# Patient Record
Sex: Male | Born: 1985 | Race: Black or African American | Hispanic: No | Marital: Single | Smoking: Current every day smoker
Health system: Southern US, Community
[De-identification: ages and names within clinical notes are randomized; demographics above are authoritative.]

## PROBLEM LIST (undated history)

## (undated) ENCOUNTER — Emergency Department: Admission: EM | Payer: Medicaid Other | Source: Home / Self Care

## (undated) DIAGNOSIS — I10 Essential (primary) hypertension: Secondary | ICD-10-CM

---

## 2003-12-08 ENCOUNTER — Emergency Department (HOSPITAL_COMMUNITY): Admission: EM | Admit: 2003-12-08 | Discharge: 2003-12-08 | Payer: Self-pay | Admitting: Family Medicine

## 2004-01-24 ENCOUNTER — Emergency Department (HOSPITAL_COMMUNITY): Admission: EM | Admit: 2004-01-24 | Discharge: 2004-01-24 | Payer: Self-pay | Admitting: Emergency Medicine

## 2004-01-24 ENCOUNTER — Emergency Department: Payer: Self-pay | Admitting: Emergency Medicine

## 2005-04-29 ENCOUNTER — Emergency Department: Payer: Self-pay | Admitting: Emergency Medicine

## 2005-04-30 ENCOUNTER — Emergency Department: Payer: Self-pay | Admitting: Unknown Physician Specialty

## 2005-08-21 ENCOUNTER — Emergency Department: Payer: Self-pay | Admitting: Emergency Medicine

## 2007-06-03 ENCOUNTER — Emergency Department: Payer: Self-pay | Admitting: Emergency Medicine

## 2008-07-27 ENCOUNTER — Emergency Department: Payer: Self-pay | Admitting: Emergency Medicine

## 2009-08-29 ENCOUNTER — Emergency Department: Payer: Self-pay | Admitting: Emergency Medicine

## 2009-12-09 ENCOUNTER — Emergency Department: Payer: Self-pay | Admitting: Emergency Medicine

## 2010-07-02 ENCOUNTER — Emergency Department: Payer: Self-pay | Admitting: Emergency Medicine

## 2011-06-28 ENCOUNTER — Emergency Department: Payer: Self-pay | Admitting: Emergency Medicine

## 2012-02-19 ENCOUNTER — Emergency Department: Payer: Self-pay | Admitting: Emergency Medicine

## 2012-02-24 ENCOUNTER — Emergency Department: Payer: Self-pay | Admitting: Emergency Medicine

## 2012-03-01 ENCOUNTER — Emergency Department: Payer: Self-pay | Admitting: Emergency Medicine

## 2012-03-10 ENCOUNTER — Emergency Department: Payer: Self-pay | Admitting: Emergency Medicine

## 2013-09-09 ENCOUNTER — Emergency Department: Payer: Self-pay | Admitting: Emergency Medicine

## 2016-09-08 ENCOUNTER — Encounter (HOSPITAL_COMMUNITY): Payer: Self-pay

## 2016-09-08 ENCOUNTER — Emergency Department (HOSPITAL_COMMUNITY)
Admission: EM | Admit: 2016-09-08 | Discharge: 2016-09-08 | Disposition: A | Discharge status: Home / Self Care | Payer: No Typology Code available for payment source | Attending: Emergency Medicine | Admitting: Emergency Medicine

## 2016-09-08 DIAGNOSIS — S161XXA Strain of muscle, fascia and tendon at neck level, initial encounter: Secondary | ICD-10-CM | POA: Insufficient documentation

## 2016-09-08 DIAGNOSIS — Y999 Unspecified external cause status: Secondary | ICD-10-CM | POA: Diagnosis not present

## 2016-09-08 DIAGNOSIS — Y92410 Unspecified street and highway as the place of occurrence of the external cause: Secondary | ICD-10-CM | POA: Diagnosis not present

## 2016-09-08 DIAGNOSIS — S199XXA Unspecified injury of neck, initial encounter: Secondary | ICD-10-CM | POA: Diagnosis present

## 2016-09-08 DIAGNOSIS — Y9389 Activity, other specified: Secondary | ICD-10-CM | POA: Diagnosis not present

## 2016-09-08 DIAGNOSIS — F17200 Nicotine dependence, unspecified, uncomplicated: Secondary | ICD-10-CM | POA: Diagnosis not present

## 2016-09-08 MED ORDER — NAPROXEN 500 MG PO TABS: 500.0000 mg | ORAL_TABLET | Freq: Two times a day (BID) | ORAL | 0 refills | Status: DC

## 2016-09-08 MED ORDER — METHOCARBAMOL 500 MG PO TABS: 500.0000 mg | ORAL_TABLET | Freq: Two times a day (BID) | ORAL | 0 refills | Status: DC

## 2016-09-08 NOTE — Discharge Instructions (Signed)
Do not drive while taking the muscle relaxant as it will make you sleepy. °
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
## 2017-08-15 ENCOUNTER — Other Ambulatory Visit: Payer: Self-pay

## 2017-08-15 DIAGNOSIS — Y999 Unspecified external cause status: Secondary | ICD-10-CM | POA: Insufficient documentation

## 2017-08-15 DIAGNOSIS — W228XXA Striking against or struck by other objects, initial encounter: Secondary | ICD-10-CM | POA: Insufficient documentation

## 2017-08-15 DIAGNOSIS — Z23 Encounter for immunization: Secondary | ICD-10-CM | POA: Insufficient documentation

## 2017-08-15 DIAGNOSIS — Y929 Unspecified place or not applicable: Secondary | ICD-10-CM | POA: Insufficient documentation

## 2017-08-15 DIAGNOSIS — F17210 Nicotine dependence, cigarettes, uncomplicated: Secondary | ICD-10-CM | POA: Insufficient documentation

## 2017-08-15 DIAGNOSIS — S0181XA Laceration without foreign body of other part of head, initial encounter: Secondary | ICD-10-CM | POA: Insufficient documentation

## 2017-08-15 DIAGNOSIS — Y939 Activity, unspecified: Secondary | ICD-10-CM | POA: Insufficient documentation

## 2017-08-15 DIAGNOSIS — Z79899 Other long term (current) drug therapy: Secondary | ICD-10-CM | POA: Insufficient documentation

## 2017-08-15 NOTE — ED Triage Notes (Signed)
Pt was working on storage building when garage door fell and hit him in forehead. Lac noted to area with no bleeding at this time. No loc, pt alert and awake.

## 2017-08-16 ENCOUNTER — Emergency Department: Payer: Self-pay

## 2017-08-16 ENCOUNTER — Emergency Department
Admission: EM | Admit: 2017-08-16 | Discharge: 2017-08-16 | Disposition: A | Discharge status: Home / Self Care | Payer: Self-pay | Attending: Emergency Medicine | Admitting: Emergency Medicine

## 2017-08-16 DIAGNOSIS — S0181XA Laceration without foreign body of other part of head, initial encounter: Secondary | ICD-10-CM

## 2017-08-16 MED ORDER — LIDOCAINE HCL (PF) 1 % IJ SOLN
5.0000 mL | Freq: Once | INTRAMUSCULAR | Status: AC
  Administered 2017-08-16: 5 mL via INTRADERMAL

## 2017-08-16 MED ORDER — LIDOCAINE HCL (PF) 1 % IJ SOLN
INTRAMUSCULAR | Status: AC
  Filled 2017-08-16: qty 5

## 2017-08-16 MED ORDER — TETANUS-DIPHTH-ACELL PERTUSSIS 5-2.5-18.5 LF-MCG/0.5 IM SUSP
0.5000 mL | Freq: Once | INTRAMUSCULAR | Status: AC
  Administered 2017-08-16: 0.5 mL via INTRAMUSCULAR

## 2017-08-16 MED ORDER — TETANUS-DIPHTH-ACELL PERTUSSIS 5-2.5-18.5 LF-MCG/0.5 IM SUSP
INTRAMUSCULAR | Status: AC
  Filled 2017-08-16: qty 0.5

## 2017-08-16 NOTE — ED Provider Notes (Signed)
[facility] Emergency Department Provider Note   First MD Initiated Contact with Patient 08/16/17 0142     (approximate)  I have reviewed the triage vital signs and the nursing notes.   HISTORY  Chief Complaint Laceration    HPI Bryan Moody is a 32 y.o. male presents to the emergency department with history of being struck on the forehead by a storage garage door with resultant laceration.  Patient admits to being dazed initially and had a brief episode of confusion while in route to the emergency department.  Patient denies any weakness numbness gait instability or visual changes.   Past medical history None There are no active problems to display for this patient.   Past surgical history None  Prior to Admission medications   Medication Sig Start Date End Date Taking? Authorizing Provider  methocarbamol (ROBAXIN) 500 MG tablet Take 1 tablet (500 mg total) by mouth 2 (two) times daily. 09/08/16   Janne NapoleonNeese, Hope M, NP  naproxen (NAPROSYN) 500 MG tablet Take 1 tablet (500 mg total) by mouth 2 (two) times daily. 09/08/16   Janne NapoleonNeese, Hope M, NP    Allergies No known drug allergies  No family history on file.  Social History Social History   Tobacco Use  . Smoking status: Current Every Day Smoker  . Smokeless tobacco: Never Used  Substance Use Topics  . Alcohol use: Yes  . Drug use: Yes    Types: Marijuana    Review of Systems Constitutional: No fever/chills Eyes: No visual changes. ENT: No sore throat. Cardiovascular: Denies chest pain. Respiratory: Denies shortness of breath. Gastrointestinal: No abdominal pain.  No nausea, no vomiting.  No diarrhea.  No constipation. Genitourinary: Negative for dysuria. Musculoskeletal: Negative for neck pain.  Negative for back pain. Integumentary: Negative for rash.  Positive for laceration Neurological: Negative for headaches, focal weakness or  numbness.  ____________________________________________   PHYSICAL EXAM:  VITAL SIGNS: ED Triage Vitals  Enc Vitals Group     BP 08/15/17 2315 (!) 150/102     Pulse Rate 08/15/17 2315 69     Resp 08/15/17 2315 16     Temp 08/15/17 2315 99.1 F (37.3 C)     Temp Source 08/15/17 2315 Oral     SpO2 08/15/17 2315 99 %     Weight 08/15/17 2318 72.6 kg (160 lb)     Height 08/15/17 2318 1.803 m (5\' 11" )     Head Circumference --      Peak Flow --      Pain Score 08/15/17 2318 7     Pain Loc --      Pain Edu? --      Excl. in GC? --     Constitutional: Alert and oriented. Well appearing and in no acute distress. Eyes: Conjunctivae are normal. PERRL. EOMI. Head: 7 cm linear forehead laceration Mouth/Throat: Mucous membranes are moist.  Oropharynx non-erythematous. Neck: No stridor. No cervical spine tenderness to palpation. Cardiovascular: Normal rate, regular rhythm. Good peripheral circulation. Grossly normal heart sounds. Respiratory: Normal respiratory effort.  No retractions. Lungs CTAB. Gastrointestinal: Soft and nontender. No distention.  Musculoskeletal: No lower extremity tenderness nor edema. No gross deformities of extremities. Neurologic:  Normal speech and language. No gross focal neurologic deficits are appreciated.  Skin:  7 cm linear forehead laceration Psychiatric: Mood and affect are normal. Speech and behavior are normal.  ____________________________________________   ________________________  RADIOLOGY I, Darci CurrentANDOLPH N Elinore Shults, personally viewed and evaluated these images (plain radiographs)  as part of my medical decision making, as well as reviewing the written report by the radiologist.  ED MD interpretation:  Forehead scalp contusion  Official radiology report(s): Ct Head Wo Contrast  Result Date: 08/16/2017 CLINICAL DATA:  Garage door fell and hit the patient on forehead. EXAM: CT HEAD WITHOUT CONTRAST TECHNIQUE: Contiguous axial images were obtained  from the base of the skull through the vertex without intravenous contrast. COMPARISON:  None. FINDINGS: Brain: No evidence of acute infarction, hemorrhage, hydrocephalus, extra-axial collection or mass lesion/mass effect. Vascular: No hyperdense vessel or unexpected calcification. Skull: Normal. Negative for fracture or focal lesion. Sinuses/Orbits: No acute finding. Other: Mild forehead scalp contusion. IMPRESSION: 1. Scalp contusion of the forehead. 2. No acute intracranial abnormality. Electronically Signed   By: Tollie Eth M.D.   On: 08/16/2017 02:26      .Marland KitchenLaceration Repair Date/Time: 08/16/2017 3:32 AM Performed by: Darci Current, MD Authorized by: Darci Current, MD   Consent:    Consent obtained:  Verbal   Consent given by:  Patient   Risks discussed:  Infection, pain, retained foreign body, poor cosmetic result and poor wound healing Anesthesia (see MAR for exact dosages):    Anesthesia method:  Local infiltration   Local anesthetic:  Lidocaine 1% w/o epi Laceration details:    Location:  Scalp   Scalp location:  Frontal   Length (cm):  7 Repair type:    Repair type:  Simple Exploration:    Hemostasis achieved with:  Direct pressure   Wound exploration: entire depth of wound probed and visualized     Contaminated: no   Treatment:    Area cleansed with:  Saline   Amount of cleaning:  Extensive   Irrigation solution:  Sterile saline   Visualized foreign bodies/material removed: no   Skin repair:    Repair method:  Sutures   Suture size:  6-0   Suture material:  Nylon   Number of sutures:  8 Approximation:    Approximation:  Close Post-procedure details:    Dressing:  Sterile dressing   Patient tolerance of procedure:  Tolerated well, no immediate complications     ____________________________________________   INITIAL IMPRESSION / ASSESSMENT AND PLAN / ED COURSE  As part of my medical decision making, I reviewed the following data within the  electronic MEDICAL RECORD NUMBER\ 32 year old male presented with above-stated history and physical exam secondary to forehead injury with laceration.  Wound repaired without difficulty.  Patient given tetanus shot as it has been greater than 5 years since last tetanus.  ____________________________________________  FINAL CLINICAL IMPRESSION(S) / ED DIAGNOSES  Final diagnoses:  Laceration of forehead, initial encounter     MEDICATIONS GIVEN DURING THIS VISIT:  Medications  lidocaine (PF) (XYLOCAINE) 1 % injection 5 mL (5 mLs Intradermal Given 08/16/17 0223)  Tdap (BOOSTRIX) injection 0.5 mL (0.5 mLs Intramuscular Given 08/16/17 0154)     ED Discharge Orders    None       Note:  This document was prepared using Dragon voice recognition software and may include unintentional dictation errors.    Darci Current, MD 08/16/17 [id number]

## 2017-08-24 ENCOUNTER — Encounter: Payer: Self-pay | Admitting: Emergency Medicine

## 2017-08-24 ENCOUNTER — Emergency Department
Admission: EM | Admit: 2017-08-24 | Discharge: 2017-08-24 | Disposition: A | Discharge status: Home / Self Care | Payer: Medicaid Other | Attending: Emergency Medicine | Admitting: Emergency Medicine

## 2017-08-24 ENCOUNTER — Other Ambulatory Visit: Payer: Self-pay

## 2017-08-24 DIAGNOSIS — Z4802 Encounter for removal of sutures: Secondary | ICD-10-CM | POA: Insufficient documentation

## 2017-08-24 NOTE — ED Notes (Signed)
8 sutures removed without difficulty. No bleeding or breaks in the skin. Wound is completely closed with no drainage.

## 2017-08-24 NOTE — ED Triage Notes (Signed)
Here for suture removal

## 2017-08-24 NOTE — ED Provider Notes (Signed)
[facility] Emergency Department Provider Note   ____________________________________________   First MD Initiated Contact with Patient 08/24/17 1233     (approximate)  I have reviewed the triage vital signs and the nursing notes.   HISTORY  Chief Complaint Suture / Staple Removal    HPI Bryan Moody is a 32 y.o. male patient presents for suture removal from the forehead secondary to laceration on 08/16/2017.  Patient voices no complaints.  History reviewed. No pertinent past medical history.  There are no active problems to display for this patient.   History reviewed. No pertinent surgical history.  Prior to Admission medications   Medication Sig Start Date End Date Taking? Authorizing Provider  methocarbamol (ROBAXIN) 500 MG tablet Take 1 tablet (500 mg total) by mouth 2 (two) times daily. 09/08/16   Janne Napoleon, NP  naproxen (NAPROSYN) 500 MG tablet Take 1 tablet (500 mg total) by mouth 2 (two) times daily. 09/08/16   Janne Napoleon, NP    Allergies Patient has no known allergies.  No family history on file.  Social History Social History   Tobacco Use  . Smoking status: Current Every Day Smoker  . Smokeless tobacco: Never Used  Substance Use Topics  . Alcohol use: Yes  . Drug use: Yes    Types: Marijuana    Review of Systems  Constitutional: No fever/chills Eyes: No visual changes. ENT: No sore throat. Cardiovascular: Denies chest pain. Respiratory: Denies shortness of breath. Gastrointestinal: No abdominal pain.  No nausea, no vomiting.  No diarrhea.  No constipation. Genitourinary: Negative for dysuria. Musculoskeletal: Negative for back pain. Skin: Negative for rash. Neurological: Negative for headaches, focal weakness or numbness.   ____________________________________________   PHYSICAL EXAM:  VITAL SIGNS: ED Triage Vitals [08/24/17 1227]  Enc Vitals Group     BP (!) 164/105     Pulse Rate 65     Resp 14   Temp 98.4 F (36.9 C)     Temp Source Oral     SpO2 99 %     Weight 160 lb (72.6 kg)     Height 5\' 11"  (1.803 m)     Head Circumference      Peak Flow      Pain Score 1     Pain Loc      Pain Edu?      Excl. in GC?    Constitutional: Alert and oriented. Well appearing and in no acute distress. Cardiovascular: Normal rate, regular rhythm. Grossly normal heart sounds.  Good peripheral circulation.  Elevated blood pressure. Respiratory: Normal respiratory effort.  No retractions. Lungs CTAB. Neurologic:  Normal speech and language. No gross focal neurologic deficits are appreciated. No gait instability. Skin:  Skin is warm, dry and intact. No rash noted.  9 intact sutures. Psychiatric: Mood and affect are normal. Speech and behavior are normal.  ____________________________________________   LABS (all labs ordered are listed, but only abnormal results are displayed)  Labs Reviewed - No data to display ____________________________________________  EKG   ____________________________________________  RADIOLOGY  ED MD interpretation:    Official radiology report(s): No results found.  ____________________________________________   PROCEDURES  Procedure(s) performed: None  .Suture Removal Date/Time: 08/24/2017 12:54 PM Performed by: Joni Reining, PA-C Authorized by: Joni Reining, PA-C   Consent:    Consent obtained:  Verbal   Consent given by:  Patient   Risks discussed:  Bleeding, pain and wound separation Location:    Location:  Head/neck  Head/neck location:  Forehead Procedure details:    Wound appearance:  No signs of infection, good wound healing and clean   Number of sutures removed:  8 Post-procedure details:    Post-removal:  No dressing applied   Patient tolerance of procedure:  Tolerated well, no immediate complications    Critical Care performed: No  ____________________________________________   INITIAL IMPRESSION / ASSESSMENT AND  PLAN / ED COURSE  As part of my medical decision making, I reviewed the following data within the electronic MEDICAL RECORD NUMBER    Patient presents for suture removal.  8 intact sutures were removed and patient given discharge care instructions.      ____________________________________________   FINAL CLINICAL IMPRESSION(S) / ED DIAGNOSES  Final diagnoses:  Encounter for removal of sutures     ED Discharge Orders    None       Note:  This document was prepared using Dragon voice recognition software and may include unintentional dictation errors.    Joni ReiningSmith, Ronald K, PA-C 08/24/17 1256    Sharyn CreamerQuale, Mark, MD 08/24/17 [phone]

## 2018-04-04 ENCOUNTER — Emergency Department
Admission: EM | Admit: 2018-04-04 | Discharge: 2018-04-04 | Disposition: A | Discharge status: Home / Self Care | Payer: Medicaid Other | Attending: Emergency Medicine | Admitting: Emergency Medicine

## 2018-04-04 DIAGNOSIS — Z5321 Procedure and treatment not carried out due to patient leaving prior to being seen by health care provider: Secondary | ICD-10-CM | POA: Insufficient documentation

## 2018-04-04 DIAGNOSIS — M79645 Pain in left finger(s): Secondary | ICD-10-CM | POA: Insufficient documentation

## 2018-04-04 NOTE — ED Notes (Signed)
Pt reports doesn't want to wait any longer; instr to return for any new or worsening symptoms
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
## 2018-05-02 ENCOUNTER — Other Ambulatory Visit: Payer: Self-pay

## 2018-05-02 ENCOUNTER — Encounter: Payer: Self-pay | Admitting: Emergency Medicine

## 2018-05-02 ENCOUNTER — Emergency Department
Admission: EM | Admit: 2018-05-02 | Discharge: 2018-05-02 | Disposition: A | Discharge status: Home / Self Care | Payer: Medicaid Other | Attending: Emergency Medicine | Admitting: Emergency Medicine

## 2018-05-02 DIAGNOSIS — Z79899 Other long term (current) drug therapy: Secondary | ICD-10-CM | POA: Insufficient documentation

## 2018-05-02 DIAGNOSIS — F17200 Nicotine dependence, unspecified, uncomplicated: Secondary | ICD-10-CM | POA: Insufficient documentation

## 2018-05-02 DIAGNOSIS — R51 Headache: Secondary | ICD-10-CM | POA: Insufficient documentation

## 2018-05-02 DIAGNOSIS — R519 Headache, unspecified: Secondary | ICD-10-CM

## 2018-05-02 MED ORDER — KETOROLAC TROMETHAMINE 30 MG/ML IJ SOLN
30.0000 mg | Freq: Once | INTRAMUSCULAR | Status: AC
  Administered 2018-05-02: 30 mg via INTRAMUSCULAR
  Filled 2018-05-02: qty 1

## 2018-05-02 MED ORDER — BUTALBITAL-APAP-CAFFEINE 50-325-40 MG PO TABS: 1.0000 | ORAL_TABLET | Freq: Four times a day (QID) | ORAL | 0 refills | Status: AC | PRN

## 2018-05-02 MED ORDER — BUTALBITAL-APAP-CAFFEINE 50-325-40 MG PO TABS
1.0000 | ORAL_TABLET | Freq: Once | ORAL | Status: AC
  Administered 2018-05-02: 1 via ORAL
  Filled 2018-05-02: qty 1

## 2018-05-02 NOTE — ED Triage Notes (Signed)
Pt c/o headache since this am. PT states hx of migraines. NAD noted, VSS, speech clear
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
## 2018-05-21 ENCOUNTER — Emergency Department
Admission: EM | Admit: 2018-05-21 | Discharge: 2018-05-21 | Disposition: A | Discharge status: Home / Self Care | Payer: Medicaid Other | Attending: Emergency Medicine | Admitting: Emergency Medicine

## 2018-05-21 ENCOUNTER — Encounter: Payer: Self-pay | Admitting: Emergency Medicine

## 2018-05-21 ENCOUNTER — Other Ambulatory Visit: Payer: Self-pay

## 2018-05-21 DIAGNOSIS — F17210 Nicotine dependence, cigarettes, uncomplicated: Secondary | ICD-10-CM | POA: Insufficient documentation

## 2018-05-21 DIAGNOSIS — M546 Pain in thoracic spine: Secondary | ICD-10-CM

## 2018-05-21 DIAGNOSIS — I10 Essential (primary) hypertension: Secondary | ICD-10-CM | POA: Insufficient documentation

## 2018-05-21 HISTORY — DX: Essential (primary) hypertension: I10
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
## 2018-05-21 NOTE — ED Notes (Signed)
EDP at bedside  

## 2019-01-09 ENCOUNTER — Ambulatory Visit: Payer: Self-pay | Admitting: Physician Assistant

## 2019-01-09 ENCOUNTER — Other Ambulatory Visit: Payer: Self-pay

## 2019-01-09 DIAGNOSIS — Z113 Encounter for screening for infections with a predominantly sexual mode of transmission: Secondary | ICD-10-CM

## 2019-01-09 LAB — GRAM STAIN

## 2019-01-10 ENCOUNTER — Encounter: Payer: Self-pay | Admitting: Physician Assistant

## 2019-01-10 NOTE — Progress Notes (Signed)
    STI clinic/screening visit  Subjective:  Bryan Moody is a 33 y.o. male being seen today for an STI screening visit. The patient reports they do not have symptoms.  Patient has the following medical conditions:  There are no active problems to display for this patient.    Chief Complaint  Patient presents with  . SEXUALLY TRANSMITTED DISEASE    HPI  Patient reports that he has had an odor to his urine for years.  Denies other symptoms.  Requests screening today.  See flowsheet for further details and programmatic requirements.    The following portions of the patient's history were reviewed and updated as appropriate: allergies, current medications, past medical history, past social history, past surgical history and problem list.  Objective:  There were no vitals filed for this visit.  Physical Exam Constitutional:      General: He is not in acute distress.    Appearance: Normal appearance.  HENT:     Head: Normocephalic and atraumatic.     Mouth/Throat:     Mouth: Mucous membranes are moist.     Pharynx: Oropharynx is clear. No oropharyngeal exudate or posterior oropharyngeal erythema.  Eyes:     Conjunctiva/sclera: Conjunctivae normal.  Neck:     Musculoskeletal: Neck supple.  Pulmonary:     Effort: Pulmonary effort is normal.  Abdominal:     Palpations: Abdomen is soft. There is no mass.     Tenderness: There is no abdominal tenderness. There is no guarding or rebound.  Genitourinary:    Penis: Normal.      Scrotum/Testes: Normal.     Comments: Pubic area without nits, lice, edema, erythema, lesions, and inguinal adenopathy. Penis circumcised and without discharge at meatus. Lymphadenopathy:     Cervical: No cervical adenopathy.  Skin:    General: Skin is warm and dry.     Findings: No bruising, erythema, lesion or rash.  Neurological:     Mental Status: He is alert and oriented to person, place, and time.  Psychiatric:        Mood and Affect: Mood  normal.        Behavior: Behavior normal.        Thought Content: Thought content normal.        Judgment: Judgment normal.       Assessment and Plan:  Bryan Moody is a 33 y.o. male presenting to the [facility] Department for STI screening  1. Screening for STD (sexually transmitted disease) Patient into clinic requesting screening.  Declines blood work today. Rec condoms with all sex. Await test results.  Counseled that RN will call if needs to RTC for any treatment once results are back. - Chlamydia/Gonorrhea [facility] - Gram stain     No follow-ups on file.  No future appointments.  Jerene Dilling, PA

## 2019-02-12 ENCOUNTER — Encounter: Payer: Self-pay | Admitting: Emergency Medicine

## 2019-02-12 ENCOUNTER — Emergency Department
Admission: EM | Admit: 2019-02-12 | Discharge: 2019-02-12 | Disposition: A | Discharge status: Home / Self Care | Payer: No Typology Code available for payment source | Attending: Emergency Medicine | Admitting: Emergency Medicine

## 2019-02-12 DIAGNOSIS — M79605 Pain in left leg: Secondary | ICD-10-CM | POA: Insufficient documentation

## 2019-02-12 DIAGNOSIS — M545 Low back pain: Secondary | ICD-10-CM | POA: Insufficient documentation

## 2019-02-12 DIAGNOSIS — M79604 Pain in right leg: Secondary | ICD-10-CM | POA: Insufficient documentation

## 2019-02-12 MED ORDER — MELOXICAM 15 MG PO TABS: 15.0000 mg | ORAL_TABLET | Freq: Every day | ORAL | 1 refills | Status: AC

## 2019-02-12 MED ORDER — METHOCARBAMOL 500 MG PO TABS: 500.0000 mg | ORAL_TABLET | Freq: Three times a day (TID) | ORAL | 0 refills | Status: AC | PRN

## 2019-02-12 MED ORDER — KETOROLAC TROMETHAMINE 30 MG/ML IJ SOLN
30.0000 mg | Freq: Once | INTRAMUSCULAR | Status: AC
  Administered 2019-02-12: 21:00:00 30 mg via INTRAMUSCULAR
  Filled 2019-02-12: qty 1
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
## 2020-05-16 ENCOUNTER — Emergency Department
Admission: EM | Admit: 2020-05-16 | Discharge: 2020-05-16 | Disposition: A | Discharge status: Home / Self Care | Payer: Self-pay | Attending: Student in an Organized Health Care Education/Training Program | Admitting: Student in an Organized Health Care Education/Training Program

## 2020-05-16 ENCOUNTER — Other Ambulatory Visit: Payer: Self-pay

## 2020-05-16 ENCOUNTER — Emergency Department: Payer: Self-pay

## 2020-05-16 ENCOUNTER — Encounter: Payer: Self-pay | Admitting: Emergency Medicine

## 2020-05-16 DIAGNOSIS — I10 Essential (primary) hypertension: Secondary | ICD-10-CM | POA: Insufficient documentation

## 2020-05-16 DIAGNOSIS — F17210 Nicotine dependence, cigarettes, uncomplicated: Secondary | ICD-10-CM | POA: Insufficient documentation

## 2020-05-16 DIAGNOSIS — M5431 Sciatica, right side: Secondary | ICD-10-CM

## 2020-05-16 DIAGNOSIS — R202 Paresthesia of skin: Secondary | ICD-10-CM | POA: Insufficient documentation

## 2020-05-16 MED ORDER — PREDNISONE 10 MG PO TABS: ORAL_TABLET | ORAL | 0 refills | Status: DC

## 2020-05-16 MED ORDER — DEXAMETHASONE SODIUM PHOSPHATE 10 MG/ML IJ SOLN
10.0000 mg | Freq: Once | INTRAMUSCULAR | Status: AC
  Administered 2020-05-16: 10 mg via INTRAMUSCULAR
  Filled 2020-05-16: qty 1

## 2020-05-16 MED ORDER — TRAMADOL HCL 50 MG PO TABS: 50.0000 mg | ORAL_TABLET | Freq: Four times a day (QID) | ORAL | 0 refills | Status: DC | PRN

## 2020-05-16 NOTE — ED Notes (Signed)
See triage note  Presents with lower back pain which is moving into right leg  States pain started several months ago but has gotten worse  Unable to bear full wt  Denies any recent injury

## 2020-05-16 NOTE — ED Provider Notes (Signed)
[facility] Emergency Department Provider Note  ____________________________________________   Event Date/Time   First MD Initiated Contact with Patient 05/16/20 [phone]     (approximate)  I have reviewed the triage vital signs and the nursing notes.   HISTORY  Chief Complaint Back Pain   HPI Bryan Moody is a 35 y.o. male presents to the ED with complaint of back pain for approximately 6 months.  Patient states that he now has numbness radiating down into his right hip and leg.  He denies any recent injury.  He did have an injury to his back in 2014 but has not had to have routine visits with orthopedics or neurosurgery.  Patient states that he has been taking ibuprofen 5 tablets 3 times a day without any relief of his back pain.  He denies any paresthesias, incontinence of bowel or bladder.  He continues to ambulate without any assistance and has been going to work.  Rates his pain as 6 out of 10.  Patient is also aware that his blood pressure is elevated.  He states that off and on it has been elevated but refuses to take anything as he is "too young to take medicine".         Past Medical History:  Diagnosis Date  . Hypertension     There are no problems to display for this patient.   History reviewed. No pertinent surgical history.  Prior to Admission medications   Medication Sig Start Date End Date Taking? Authorizing Provider  predniSONE (DELTASONE) 10 MG tablet Take 6 tablets  today, on day 2 take 5 tablets, day 3 take 4 tablets, day 4 take 3 tablets, day 5 take  2 tablets and 1 tablet the last day 05/16/20  Yes Bridget Hartshorn L, PA-C  traMADol (ULTRAM) 50 MG tablet Take 1 tablet (50 mg total) by mouth every 6 (six) hours as needed. 05/16/20  Yes Tommi Rumps, PA-C    Allergies Patient has no known allergies.  History reviewed. No pertinent family history.  Social History Social History   Tobacco Use  . Smoking status: Current  Every Day Smoker    Packs/day: 1.00    Types: Cigarettes  . Smokeless tobacco: Never Used  Vaping Use  . Vaping Use: Never used  Substance Use Topics  . Alcohol use: Yes    Comment: occasional  . Drug use: Not Currently    Types: Marijuana    Review of Systems Constitutional: No fever/chills Eyes: No visual changes. Cardiovascular: Denies chest pain. Respiratory: Denies shortness of breath. Gastrointestinal: No abdominal pain.  No nausea, no vomiting.   Genitourinary: Negative for dysuria. Musculoskeletal: Positive low back pain, chronic.  Positive right leg radiculopathy. Skin: Negative for rash. Neurological: Negative for headaches, focal weakness or numbness.   ____________________________________________   PHYSICAL EXAM:  VITAL SIGNS: ED Triage Vitals  Enc Vitals Group     BP 05/16/20 0846 (!) 172/129     Pulse Rate 05/16/20 0846 75     Resp 05/16/20 0846 16     Temp 05/16/20 0846 98 F (36.7 C)     Temp Source 05/16/20 0846 Oral     SpO2 05/16/20 0846 100 %     Weight 05/16/20 0852 165 lb (74.8 kg)     Height 05/16/20 0852 5\' 11"  (1.803 m)     Head Circumference --      Peak Flow --      Pain Score 05/16/20 0852 6  Pain Loc --      Pain Edu? --      Excl. in GC? --     Constitutional: Alert and oriented. Well appearing and in no acute distress. Eyes: Conjunctivae are normal.  Head: Atraumatic. Neck: No stridor.   Cardiovascular: Normal rate, regular rhythm. Grossly normal heart sounds.  Good peripheral circulation. Respiratory: Normal respiratory effort.  No retractions. Lungs CTAB. Gastrointestinal: Soft and nontender. No distention.  Bowel sounds normoactive x4 quadrants. Musculoskeletal: Nontender thoracic or lumbar spine.  There is tenderness on palpation of the right SI joint area and surrounding tissue.  Patient has good muscle strength bilaterally.  Motor sensory function intact.  Straight leg raises are approximately 30 degrees right leg with  discomfort.  Left leg approximately 40 degrees.  Patient is ambulatory without any assistance with an antalgic gait. Neurologic:  Normal speech and language.  Reflexes lower extremities 2+ bilaterally.  No gross focal neurologic deficits are appreciated.  Skin:  Skin is warm, dry and intact.  Psychiatric: Mood and affect are normal. Speech and behavior are normal.  ____________________________________________   LABS (all labs ordered are listed, but only abnormal results are displayed)  Labs Reviewed - No data to display ____________________________________________  RADIOLOGY I, Tommi Rumps, personally viewed and evaluated these images (plain radiographs) as part of my medical decision making, as well as reviewing the written report by the radiologist.  Official radiology report(s): DG Lumbar Spine 2-3 Views  Result Date: 05/16/2020 CLINICAL DATA:  35 year old male with 6 months of back pain radiating to the right hip and leg. Reports injury in 2014. EXAM: LUMBAR SPINE - 2-3 VIEW COMPARISON:  Lumbar radiographs 01/24/2004. FINDINGS: Normal lumbar segmentation. Stable lumbar lordosis. No spondylolisthesis. Stable disc spaces. No acute osseous abnormality identified. Visible sacrum and SI joints appear stable, within normal limits. Negative abdominal visceral contours. IMPRESSION: Negative radiographic appearance of the lumbar spine. Electronically Signed   By: Odessa Fleming M.D.   On: 05/16/2020 09:39    ____________________________________________   PROCEDURES  Procedure(s) performed (including Critical Care):  Procedures   ____________________________________________   INITIAL IMPRESSION / ASSESSMENT AND PLAN / ED COURSE  As part of my medical decision making, I reviewed the following data within the electronic MEDICAL RECORD NUMBER Notes from prior ED visits and [place] Controlled Substance Database  35 year old male presents to the ED with complaint of low back pain for 6 months.  He  states that he has now developed pain/numbness that radiates down his right hip and leg.  No new injury has occurred.  Patient has a history of back injury 2014.  He reports that he has been taking 1000 mg of ibuprofen 3 times a day without any relief.  Patient also was noted to have elevated blood pressure.  Patient states that he has been elevated in the past and usually higher when he is in pain.  He is disinterested in taking any blood pressure medication as he believes he is too young to take it.  X-rays of the lumbar spine show no acute bony changes.  Patient was made.  We discussed sciatica.  We also discussed discontinue taking the ibuprofen.  He is aware that the ibuprofen can cause not only stomach upset but gastric bleeding.  A Decadron injection was given to him while in the ED.  He is to begin taking prednisone tapering dose and a prescription for tramadol was sent to his pharmacy if additional pain medication is needed.  If he needs anything  in addition to the tramadol he is to take Tylenol only and also only as directed by the label with 2 tablets being the most that he takes at any one time.  He is to follow-up with his PCP or call the clinics listed on his discharge papers to obtain a PCP and have his blood pressure rechecked.  He acknowledges that persistent elevated blood pressure can lead to more serious medical problems. ____________________________________________   FINAL CLINICAL IMPRESSION(S) / ED DIAGNOSES  Final diagnoses:  Sciatica of right side     ED Discharge Orders         Ordered    predniSONE (DELTASONE) 10 MG tablet        05/16/20 0957    traMADol (ULTRAM) 50 MG tablet  Every 6 hours PRN        05/16/20 0957          *Please note:  Bryan Moody was evaluated in Emergency Department on 05/16/2020 for the symptoms described in the history of present illness. He was evaluated in the context of the global COVID-19 pandemic, which necessitated consideration that the  patient might be at risk for infection with the SARS-CoV-2 virus that causes COVID-19. Institutional protocols and algorithms that pertain to the evaluation of patients at risk for COVID-19 are in a state of rapid change based on information released by regulatory bodies including the CDC and federal and state organizations. These policies and algorithms were followed during the patient's care in the ED.  Some ED evaluations and interventions may be delayed as a result of limited staffing during and the pandemic.*   Note:  This document was prepared using Dragon voice recognition software and may include unintentional dictation errors.    Tommi Rumps, PA-C 05/16/20 1052    Willy Eddy, MD 05/16/20 1556

## 2020-05-16 NOTE — Discharge Instructions (Signed)
Call clinics listed on your discharge papers to see if they are taking new patients and make an appointment to have your blood pressure reevaluated.  [facility] is also available.  These clinics are based on your income and provide a sliding scale.  If your blood pressure continues to be elevated you most likely will be on blood pressure medication.  Begin taking steroids as directed.  Do not take any ibuprofen, Advil or Aleve while taking this medication.  A prescription for tramadol was sent to the pharmacy if needed for pain.  You may take Tylenol along with this medication if additional pain medication is needed.  Do not use more than the label indicates as you could cause damage to your liver.

## 2020-05-16 NOTE — ED Triage Notes (Addendum)
Pt via POV from home. Pt has been having back pain for about 6 months. Pt states that it now radiates to the R hip and leg. Pt had a back injury in 2014. Pt has been taking ibuprofen around the clock with only some relief.   Pt does have a hx of HTN. Pt states he refuses to take anything for it, states he is too young to take medicine. No neuro deficit noted

## 2020-06-03 ENCOUNTER — Telehealth: Payer: Self-pay

## 2020-06-03 NOTE — Telephone Encounter (Signed)
After receiving an ER referral, called pt. LVM  MD 06/03/20 @ 3:40 pm

## 2021-12-10 IMAGING — CR DG LUMBAR SPINE 2-3V
1 series · 3 of 3 positions shown · non-contrast
Comparison: Lumbar radiographs 01/24/2004.

CLINICAL DATA: 34-year-old male with 6 months of back pain
radiating to the right hip and leg. Reports injury in 9567.

EXAM:
LUMBAR SPINE - 2-3 VIEW

[Series 1: dg lumbar spine 2-3 views · 0.14mm/px · 3 of 3 slices shown]
[im 1/3]
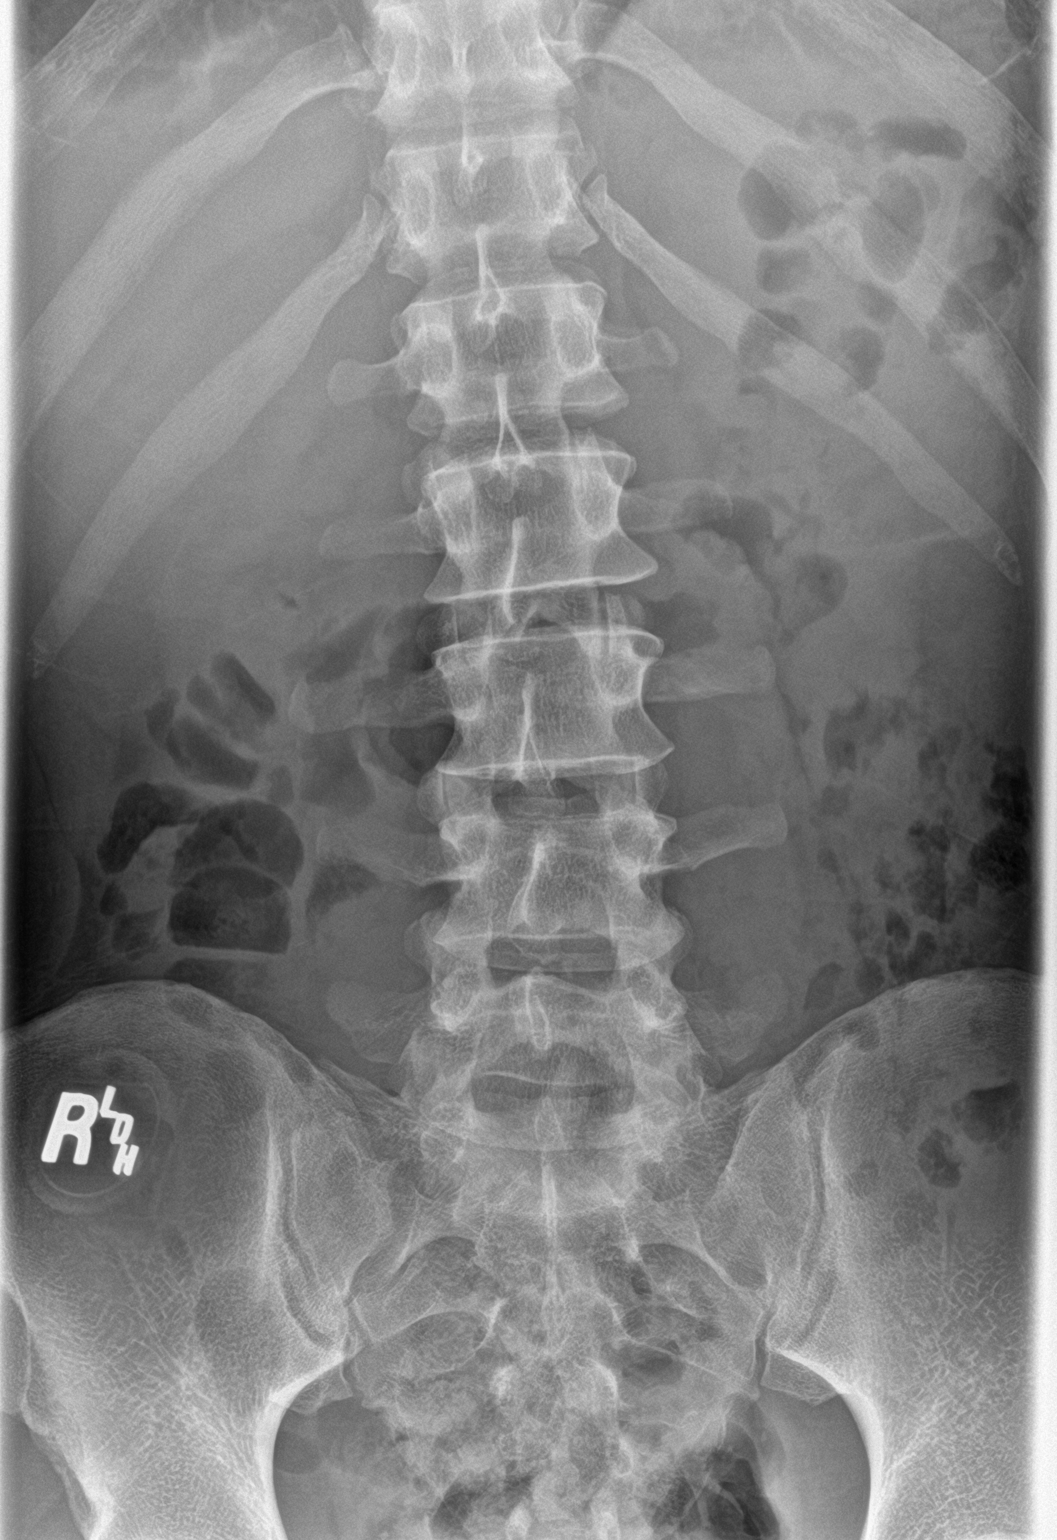
[im 2/3]
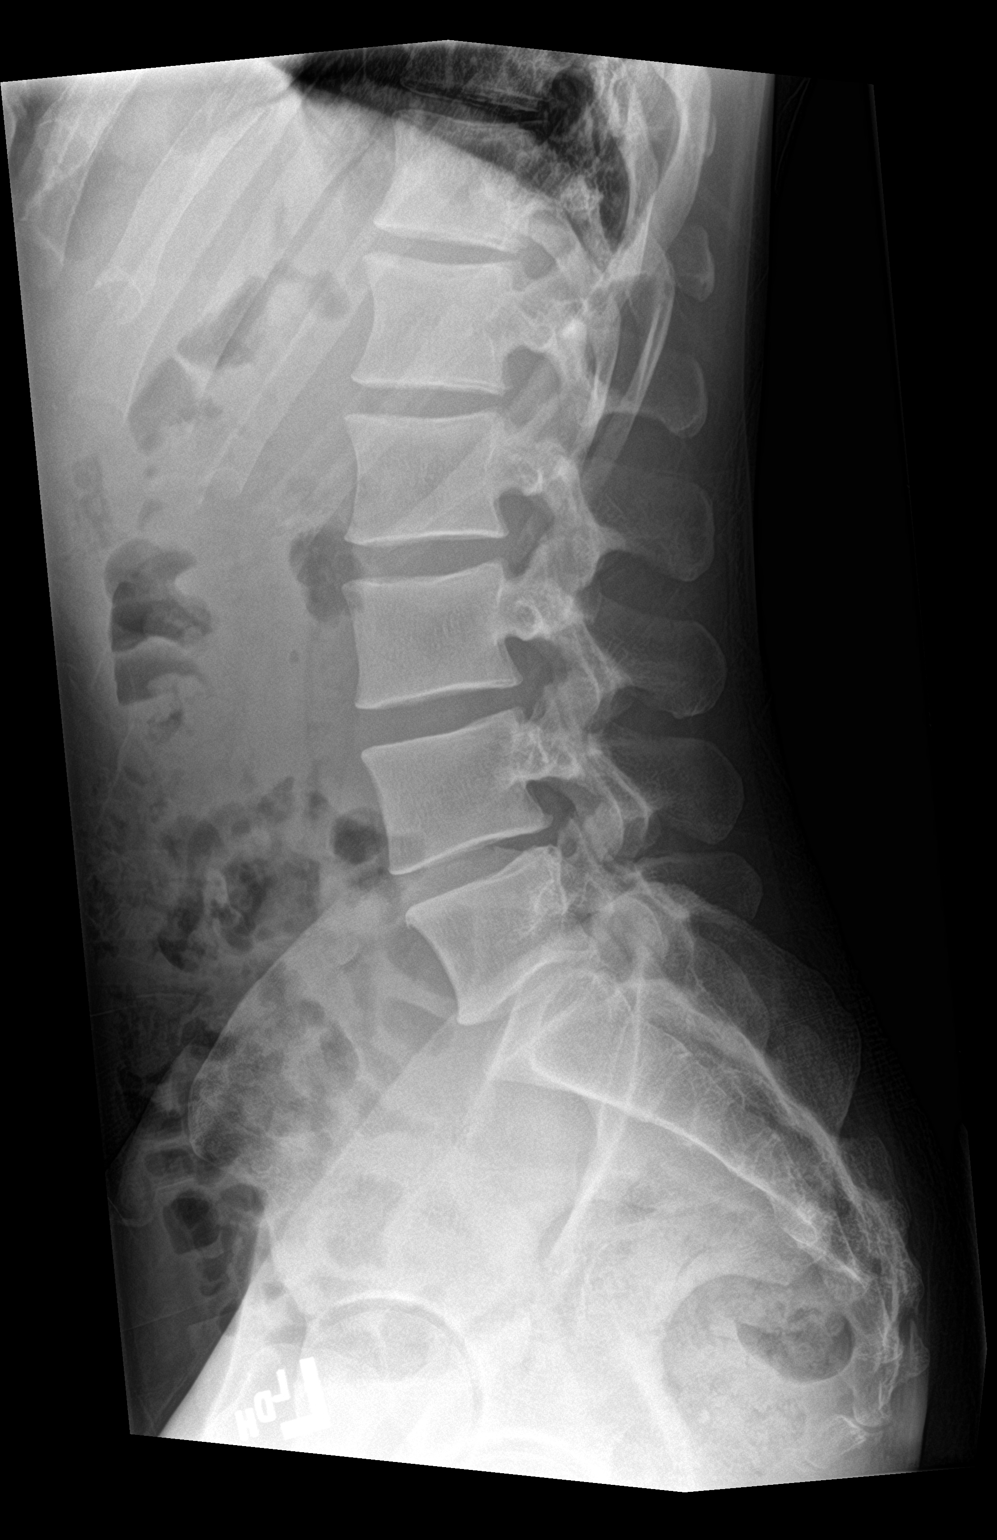
[im 3/3]
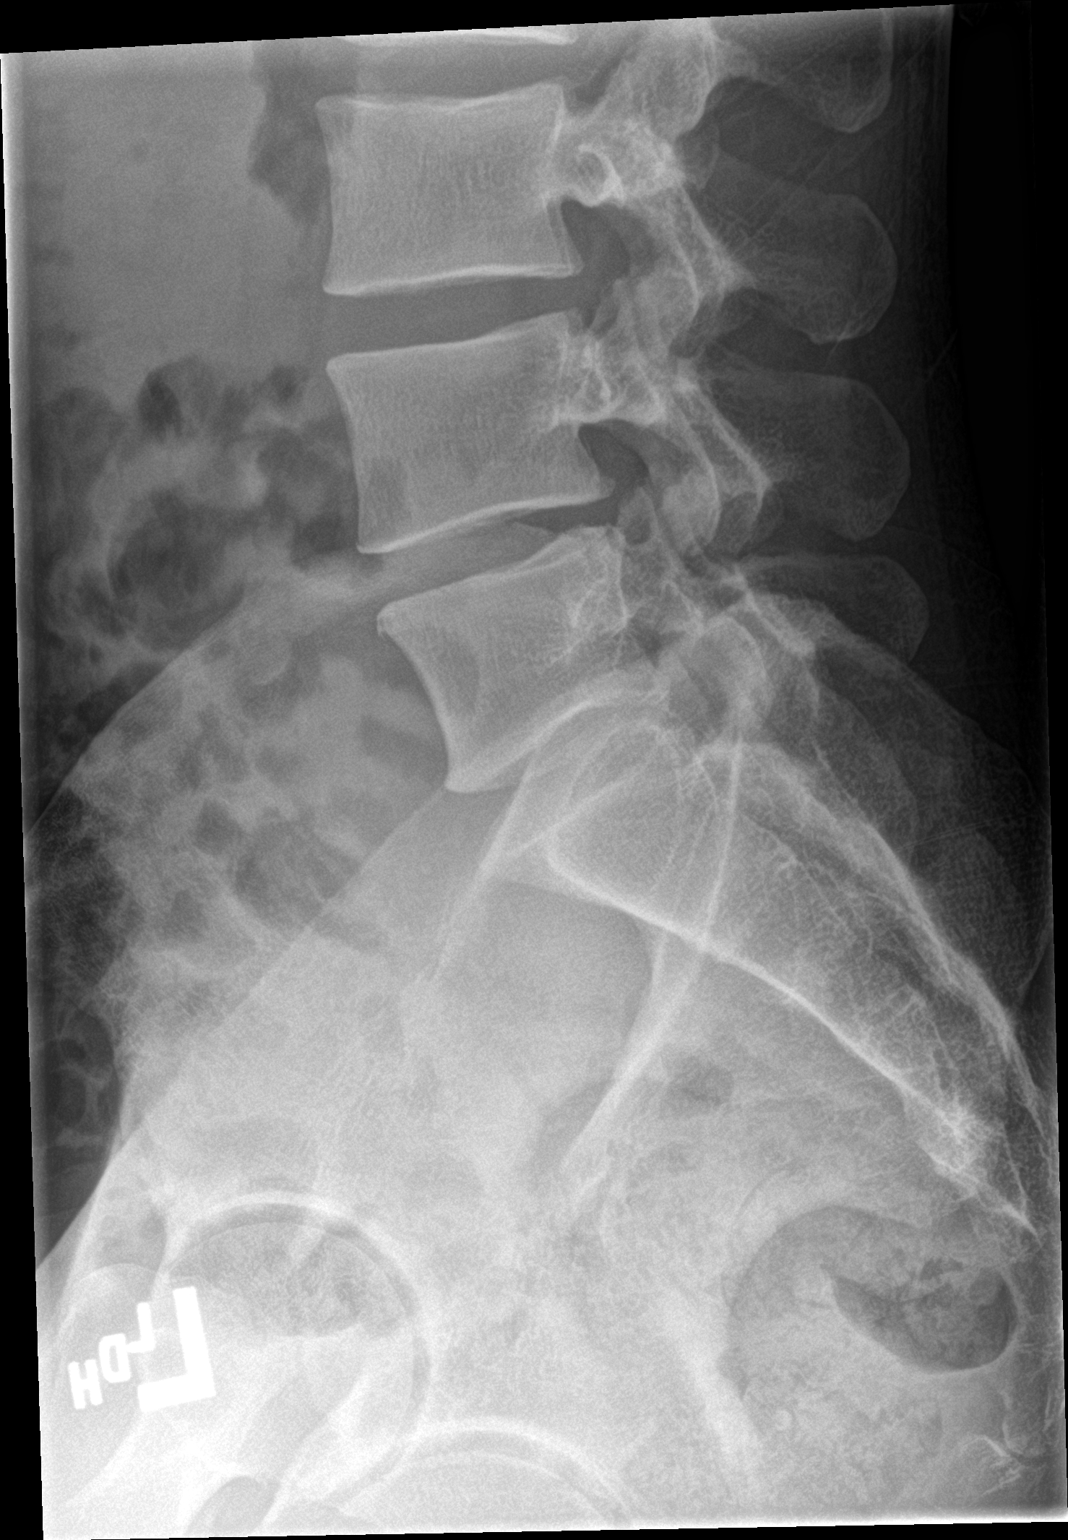

[3 of 3 positions shown; findings below may reference images not displayed]

FINDINGS: Normal lumbar segmentation. Stable lumbar lordosis. No
spondylolisthesis. Stable disc spaces. No acute osseous abnormality
identified. Visible sacrum and SI joints appear stable, within
normal limits. Negative abdominal visceral contours.
IMPRESSION: Negative radiographic appearance of the lumbar spine.

## 2021-12-14 ENCOUNTER — Emergency Department: Payer: Self-pay

## 2021-12-14 ENCOUNTER — Other Ambulatory Visit: Payer: Self-pay

## 2021-12-14 ENCOUNTER — Emergency Department
Admission: EM | Admit: 2021-12-14 | Discharge: 2021-12-14 | Disposition: A | Discharge status: Home / Self Care | Payer: Self-pay | Attending: Emergency Medicine | Admitting: Emergency Medicine

## 2021-12-14 DIAGNOSIS — B349 Viral infection, unspecified: Secondary | ICD-10-CM | POA: Insufficient documentation

## 2021-12-14 DIAGNOSIS — Z20822 Contact with and (suspected) exposure to covid-19: Secondary | ICD-10-CM | POA: Insufficient documentation

## 2021-12-14 DIAGNOSIS — I10 Essential (primary) hypertension: Secondary | ICD-10-CM | POA: Insufficient documentation

## 2021-12-14 LAB — SARS CORONAVIRUS 2 BY RT PCR: SARS Coronavirus 2 by RT PCR: NEGATIVE

## 2021-12-14 MED ORDER — AMLODIPINE BESYLATE 5 MG PO TABS: 5.0000 mg | ORAL_TABLET | Freq: Every day | ORAL | 2 refills | Status: AC

## 2021-12-14 MED ORDER — BENZONATATE 200 MG PO CAPS: 200.0000 mg | ORAL_CAPSULE | Freq: Three times a day (TID) | ORAL | 0 refills | Status: AC | PRN

## 2021-12-14 NOTE — Discharge Instructions (Addendum)
Call all the clinics listed on your discharge papers to establish a primary care provider.  Your blood pressure in the emergency department was elevated at 166/115 and your blood pressure needs to be controlled to prevent heart attacks and stroke.  You have a 25-month supply of blood pressure medication which should give you time to get an appointment with one of the clinics.  It may be necessary for you to see the [facility] clinic which is free to you until you are able to see one of the other clinics.  Also a prescription for cough medication was sent to the pharmacy to take as needed for cough.  Drink fluids to stay hydrated.  Tylenol if needed for body aches, fever or headache.

## 2021-12-14 NOTE — ED Provider Notes (Signed)
[facility] Provider Note    Event Date/Time   First MD Initiated Contact with Patient 12/14/21 [phone]     (approximate)   History   Fever (Pt. To ED via POV for fever, chills, body aches, cough and congestion, and diarrhea x8 days. )   HPI  Bryan Moody is a 36 y.o. male   to the ED with complaint of cough, congestion, fever, chills, body aches and diarrhea for the last 8 days.  Patient states that diarrhea resolved after the first day of his symptoms.  He continues to cough.  Over-the-counter ibuprofen is the only medication that he has taken.  He is concerned about pneumonia as he has been smoking for more than 20 years.  Also he has had elevated blood pressure and diagnosed with hypertension but has never taken any medication for it.  Patient is asymptomatic.      Physical Exam   Triage Vital Signs: ED Triage Vitals  Enc Vitals Group     BP 12/14/21 0909 (!) 166/115     Pulse Rate 12/14/21 0909 96     Resp 12/14/21 0909 16     Temp 12/14/21 0909 98.5 F (36.9 C)     Temp Source 12/14/21 0909 Oral     SpO2 12/14/21 0909 100 %     Weight 12/14/21 0909 165 lb (74.8 kg)     Height 12/14/21 0909 5\' 11"  (1.803 m)     Head Circumference --      Peak Flow --      Pain Score 12/14/21 0916 5     Pain Loc --      Pain Edu? --      Excl. in GC? --     Most recent vital signs: Vitals:   12/14/21 0909 12/14/21 1055  BP: (!) 166/115 (!) 157/119  Pulse: 96   Resp: 16   Temp: 98.5 F (36.9 C)   SpO2: 100%      General: Awake, no distress.  Alert, talkative. CV:  Good peripheral perfusion.  Heart regular rate and rhythm. Resp:  Normal effort.  Clear bilaterally. Abd:  No distention.  Soft, nontender, bowel sounds normoactive x4 quadrants. Other:     ED Results / Procedures / Treatments   Labs (all labs ordered are listed, but only abnormal results are displayed) Labs Reviewed  SARS CORONAVIRUS 2 BY RT PCR      RADIOLOGY Chest x-ray  images were reviewed by myself and interpreted as negative.  Official radiology report is negative for infiltrates.    PROCEDURES:  Critical Care performed:   Procedures   MEDICATIONS ORDERED IN ED: Medications - No data to display   IMPRESSION / MDM / ASSESSMENT AND PLAN / ED COURSE  I reviewed the triage vital signs and the nursing notes.   Differential diagnosis includes, but is not limited to, COVID, influenza, pneumonia, upper respiratory infection, viral illness.  36 year old male presents to the ED with complaint of fever, chills, body aches, cough and congestion with diarrhea for the last 8 days.  He is unaware of any known exposure to COVID.  Also his initial blood pressure in the emergency department was 166/115.  Patient states that he has been diagnosed with hypertension but has never taken it seriously and is not taking any medication at this time.  Work-up included a COVID swab which was negative and reassuring.  Chest x-ray was negative for pneumonia and patient was reassured.  A prescription for Tessalon  Perles was sent to the pharmacy to take as needed for cough.  We discussed his hypertension at length as the second blood pressure reading was similar to the first.  He is now willing to to take his hypertension more seriously as he is "getting older".  We will start him on amlodipine 5 mg daily.  A list of clinics was listed on his discharge papers along with the [facility] to follow-up with.  Patient is aware that he has been given a 26-month supply of blood pressure medication which should give him enough time to establish with a PCP or see the [facility] clinic.      Patient's presentation is most consistent with acute complicated illness / injury requiring diagnostic workup.  FINAL CLINICAL IMPRESSION(S) / ED DIAGNOSES   Final diagnoses:  Viral illness  Hypertension, uncontrolled     Rx / DC Orders   ED Discharge Orders          Ordered    amLODipine  (NORVASC) 5 MG tablet  Daily        12/14/21 1041    benzonatate (TESSALON) 200 MG capsule  3 times daily PRN        12/14/21 1041             Note:  This document was prepared using Dragon voice recognition software and may include unintentional dictation errors.   Johnn Hai, PA-C 12/14/21 1150    Lavonia Drafts, MD 12/14/21 1230

## 2021-12-14 NOTE — ED Triage Notes (Signed)
Pt. To ED via POV for fever, chills, body aches, cough and congestion, and diarrhea x8 days.

## 2021-12-14 NOTE — ED Notes (Signed)
Resp panel collected

## 2022-07-27 ENCOUNTER — Ambulatory Visit: Payer: 59
